# Patient Record
Sex: Female | Born: 2005 | Race: White | Hispanic: No | Marital: Single | Smoking: Never smoker
Health system: Southern US, Community
[De-identification: ages and names within clinical notes are randomized; demographics above are authoritative.]

## PROBLEM LIST (undated history)

## (undated) DIAGNOSIS — F909 Attention-deficit hyperactivity disorder, unspecified type: Secondary | ICD-10-CM

---
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
## 2023-02-07 DIAGNOSIS — F902 Attention-deficit hyperactivity disorder, combined type: Secondary | ICD-10-CM | POA: Diagnosis not present

## 2023-04-04 DIAGNOSIS — F902 Attention-deficit hyperactivity disorder, combined type: Secondary | ICD-10-CM | POA: Diagnosis not present

## 2023-04-27 DIAGNOSIS — Z3202 Encounter for pregnancy test, result negative: Secondary | ICD-10-CM | POA: Diagnosis not present

## 2023-04-27 DIAGNOSIS — Z30430 Encounter for insertion of intrauterine contraceptive device: Secondary | ICD-10-CM | POA: Diagnosis not present

## 2023-05-29 DIAGNOSIS — F902 Attention-deficit hyperactivity disorder, combined type: Secondary | ICD-10-CM | POA: Diagnosis not present

## 2023-08-01 DIAGNOSIS — F902 Attention-deficit hyperactivity disorder, combined type: Secondary | ICD-10-CM | POA: Diagnosis not present

## 2023-08-29 DIAGNOSIS — R110 Nausea: Secondary | ICD-10-CM | POA: Diagnosis not present

## 2023-08-29 DIAGNOSIS — R1013 Epigastric pain: Secondary | ICD-10-CM | POA: Diagnosis not present

## 2023-08-29 DIAGNOSIS — Z3202 Encounter for pregnancy test, result negative: Secondary | ICD-10-CM | POA: Diagnosis not present

## 2023-09-26 DIAGNOSIS — F902 Attention-deficit hyperactivity disorder, combined type: Secondary | ICD-10-CM | POA: Diagnosis not present

## 2023-11-21 DIAGNOSIS — F902 Attention-deficit hyperactivity disorder, combined type: Secondary | ICD-10-CM | POA: Diagnosis not present

## 2023-11-25 DIAGNOSIS — M79644 Pain in right finger(s): Secondary | ICD-10-CM | POA: Diagnosis not present

## 2023-11-25 DIAGNOSIS — Y92410 Unspecified street and highway as the place of occurrence of the external cause: Secondary | ICD-10-CM | POA: Diagnosis not present

## 2023-11-25 DIAGNOSIS — T1490XA Injury, unspecified, initial encounter: Secondary | ICD-10-CM | POA: Diagnosis not present

## 2023-11-25 DIAGNOSIS — W2212XA Striking against or struck by front passenger side automobile airbag, initial encounter: Secondary | ICD-10-CM | POA: Diagnosis not present

## 2023-11-25 DIAGNOSIS — R6889 Other general symptoms and signs: Secondary | ICD-10-CM | POA: Diagnosis not present

## 2023-11-25 DIAGNOSIS — R1110 Vomiting, unspecified: Secondary | ICD-10-CM | POA: Diagnosis not present

## 2023-11-25 DIAGNOSIS — S6991XA Unspecified injury of right wrist, hand and finger(s), initial encounter: Secondary | ICD-10-CM | POA: Diagnosis not present

## 2023-11-25 DIAGNOSIS — S62524A Nondisplaced fracture of distal phalanx of right thumb, initial encounter for closed fracture: Secondary | ICD-10-CM | POA: Diagnosis not present

## 2023-11-25 DIAGNOSIS — S3993XA Unspecified injury of pelvis, initial encounter: Secondary | ICD-10-CM | POA: Diagnosis not present

## 2023-11-25 DIAGNOSIS — S62521A Displaced fracture of distal phalanx of right thumb, initial encounter for closed fracture: Secondary | ICD-10-CM | POA: Diagnosis not present

## 2023-11-25 DIAGNOSIS — S62511A Displaced fracture of proximal phalanx of right thumb, initial encounter for closed fracture: Secondary | ICD-10-CM | POA: Diagnosis not present

## 2023-11-28 ENCOUNTER — Ambulatory Visit: Payer: Self-pay | Admitting: Family Medicine

## 2023-11-28 VITALS — BP 104/70 | HR 82 | Ht 61.0 in | Wt 103.0 lb

## 2023-11-28 DIAGNOSIS — S62524A Nondisplaced fracture of distal phalanx of right thumb, initial encounter for closed fracture: Secondary | ICD-10-CM

## 2023-11-28 NOTE — Patient Instructions (Signed)
 Thank you for coming in today.   Use the STAX splint. You will need to move to size 5 soon.   Recheck in 10-14 days.  We should get an xray ahead of time.   Let me know if things are not going ok.
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
## 2023-11-29 DIAGNOSIS — S62524A Nondisplaced fracture of distal phalanx of right thumb, initial encounter for closed fracture: Secondary | ICD-10-CM | POA: Insufficient documentation

## 2023-11-29 DIAGNOSIS — F900 Attention-deficit hyperactivity disorder, predominantly inattentive type: Secondary | ICD-10-CM | POA: Insufficient documentation

## 2023-12-11 ENCOUNTER — Ambulatory Visit: Admitting: Family Medicine
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
## 2023-12-18 ENCOUNTER — Ambulatory Visit: Payer: Self-pay | Admitting: Family Medicine

## 2023-12-18 ENCOUNTER — Ambulatory Visit

## 2023-12-18 DIAGNOSIS — M79641 Pain in right hand: Secondary | ICD-10-CM

## 2023-12-18 DIAGNOSIS — S62669D Nondisplaced fracture of distal phalanx of unspecified finger, subsequent encounter for fracture with routine healing: Secondary | ICD-10-CM | POA: Diagnosis not present

## 2023-12-18 NOTE — Progress Notes (Signed)
 Patient arrived over 20 minutes late.  Unfortunately I am unable to see her today.  However we will obtain the x-ray originally planned and reschedule.

## 2023-12-20 ENCOUNTER — Ambulatory Visit: Payer: Self-pay | Admitting: Family Medicine

## 2023-12-20 NOTE — Progress Notes (Signed)
 Fractures similar to what was described at [facility].  Will look at the x-rays together on your follow-up visit on the 31st.
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
## 2023-12-22 ENCOUNTER — Telehealth: Admitting: Family Medicine

## 2023-12-22 ENCOUNTER — Encounter: Payer: Self-pay | Admitting: Family Medicine

## 2023-12-22 DIAGNOSIS — S62524D Nondisplaced fracture of distal phalanx of right thumb, subsequent encounter for fracture with routine healing: Secondary | ICD-10-CM

## 2023-12-22 NOTE — Patient Instructions (Signed)
 Thank you for coming in today.   Recommend a STAX splint.   Recheck in about 1 month.

## 2024-01-09 DIAGNOSIS — F902 Attention-deficit hyperactivity disorder, combined type: Secondary | ICD-10-CM | POA: Diagnosis not present
